# Patient Record
Sex: Female | Born: 1980 | Race: Black or African American | Hispanic: No | Marital: Single | Smoking: Never smoker
Health system: Southern US, Community
[De-identification: ages and names within clinical notes are randomized; demographics above are authoritative.]

---

## 2003-08-24 ENCOUNTER — Emergency Department (HOSPITAL_COMMUNITY): Admission: EM | Admit: 2003-08-24 | Discharge: 2003-08-24 | Payer: Self-pay | Admitting: Emergency Medicine

## 2004-01-04 ENCOUNTER — Ambulatory Visit: Payer: Self-pay | Admitting: Obstetrics & Gynecology

## 2004-02-22 ENCOUNTER — Ambulatory Visit: Payer: Self-pay | Admitting: Obstetrics and Gynecology

## 2004-05-18 ENCOUNTER — Ambulatory Visit: Payer: Self-pay | Admitting: Obstetrics and Gynecology

## 2004-07-18 ENCOUNTER — Ambulatory Visit: Payer: Self-pay | Admitting: Obstetrics & Gynecology

## 2010-05-19 NOTE — Group Therapy Note (Signed)
NAMEDOTTI, BUSEY NO.:  [id number]   MEDICAL RECORD NO.:  [id number]          PATIENT TYPE:  WOC   LOCATION:  [facility]                   FACILITY:  [facility]   PHYSICIAN:  Elsie Lincoln, MD      DATE OF BIRTH:  [birth year]   DATE OF SERVICE:  07/18/2004                                    CLINIC NOTE   REASON FOR VISIT:  The patient is a 30 year old female who presents for  follow-up of her TCA treatment and new-onset vulvar irritation. The patient  had been tested for HSV in the past and the irritation went away and HSV was  negative. However, now it returns. The patient shaved in this area just  today after realizing the irritation. She does not usually shave in this  area. The area that is bothering her is her left superior labia majora near  her clitoris just at the edge of the hairline. The patient denies being  sexually active at this time. She denies any abnormal discharge. The patient  also noted she has two small warts at the fourchette near her anus and would  like these treated as well today. The patient's last Pap smear per the  patient [facility].   PHYSICAL EXAMINATION:  Tanner V. The vulva has two small warts posteriorly,  slightly above the anus, and there is also areas of excoriation near the  clitoris on the left labia majora. There is no weeping lesion or vesicle.  The vagina has a thickened white discharge, probably physiologic.   ASSESSMENT AND PLAN:  A 30 year old female with:  1.  Condyloma acuminata. TCA applied today.  2.  Contact dermatitis. The patient is to apply hydrocortisone ointment 1%      b.i.d. for a week to see if this improves this area near her clitoris.      The patient encouraged not to shave these areas.  3.  Wet prep, GC, chlamydia sent.  4.  Return to clinic p[facility]r.n.; otherwise[facility] for      Pap smear.       KL/MEDQ  D:  07/18/2004  T:  07/18/2004  Job:   [id number]

## 2010-05-19 NOTE — Group Therapy Note (Signed)
NAMENATANE, HEWARD NO.:  [id number]   MEDICAL RECORD NO.:  [id number]          PATIENT TYPE:  WOC   [facility]ATION[facility]                   FACILITY:  [facility]   PHYSICIAN:  Elsie Lincoln, MD      DATE OF BIRTH:  [birth year]   DATE OF SERVICE:  01/04/2004                                    CLINIC NOTE   REASON FOR VISIT:  The patient is a 30 year old G0 female who is referred to  me from the health department for condyloma.  The patient is in a monogamous  relationship for the past 5 years and has only had one partner.  She is  GC/chlamydia negative and had a normal Pap smear done July 29, 2003.  The  patient is not overly bothered by the condyloma but wants them off due to  the psychological aspects.  Discussed with the patient options of watchful  waiting versus Aldara versus TCA application.  The patient chose TCA  secondary to the expense of the Aldara.   PHYSICAL EXAMINATION:  See diagram in note.  TCA 80% was then applied to the  condyloma at the introitus and along the perineum.  Vaseline was applied to  the surrounding area to aid in pain.  The patient was also given an ice pack  for several minutes after application.  The patient tolerated this procedure  well.  The patient is to return to clinic in 2 weeks.      KL/MEDQ  D:  01/04/2004  T:  01/04/2004  Job:  [id number]

## 2010-05-19 NOTE — Group Therapy Note (Signed)
NAMEJAYE, SAAL NO.:  [id number]   MEDICAL RECORD NO.:  [id number]          PATIENT TYPE:  WOC   [facility]ATION[facility]                   [facility]ILITY[facility]  [facility]   PHYSICIAN:  Argentina Donovan, MD        DATE OF BIRTH:  07-03-1980   DATE OF SERVICE:  05/18/2004                                    CLINIC NOTE   CHIEF COMPLAINT:  Followup of genital warts.   HISTORY OF PRESENT ILLNESS:  This is a 30 year old African-American female  coming in today for followup of genital warts after having chloroacetic acid  treatment on February 22, 2004.  The patient states that she has not had any  complications from the treatment, and no concerns there.  However, she does  report having some vaginal irritation and burning on the outside.  The  patient states that she has had this, she said, on and off for weeks.  No  new discharge.  No dysuria.  No abdominal pain.  No fevers.   OBJECTIVE:  VITAL SIGNS:  Vital signs noted, and stable, and within normal  limits.  GENERAL:  She is alert in no apparent distress.  PELVIC:  No external genital warts of the labia or vulvae.  However, 3 small  ulcers at the apex of the vulvae, approximately 1.0 mm in size, painful to  touch, resembling HSV.   ASSESSMENT:  A 30 year old African-American female with genital warts and  vaginal irritation.   PLAN:  1.  Genital warts.  No visual genital warts at this time.  The patient was      told to call and schedule followup as needed for additional treatments.  2.  Vaginal irritation.  Considering the ulcers noted on exam, quite likely      secondary to herpes simplex virus.  A viral culture was obtained today.      A wet prep was also obtained considering the complaint of pruritus as      well and some white discharge noted on exam.      AD/MEDQ  D:  05/18/2004  T:  05/18/2004  Job:  [id number]

## 2013-05-29 ENCOUNTER — Other Ambulatory Visit (HOSPITAL_COMMUNITY): Payer: Self-pay | Admitting: *Deleted

## 2013-05-29 DIAGNOSIS — N63 Unspecified lump in unspecified breast: Secondary | ICD-10-CM

## 2013-06-02 ENCOUNTER — Encounter (HOSPITAL_COMMUNITY): Payer: Self-pay

## 2013-06-02 ENCOUNTER — Other Ambulatory Visit (HOSPITAL_COMMUNITY)
Admission: RE | Admit: 2013-06-02 | Discharge: 2013-06-02 | Disposition: A | Discharge status: Home / Self Care | Payer: No Typology Code available for payment source | Source: Ambulatory Visit | Attending: Obstetrics and Gynecology | Admitting: Obstetrics and Gynecology

## 2013-06-02 ENCOUNTER — Ambulatory Visit (HOSPITAL_COMMUNITY)
Admission: RE | Admit: 2013-06-02 | Discharge: 2013-06-02 | Disposition: A | Discharge status: Home / Self Care | Payer: No Typology Code available for payment source | Source: Ambulatory Visit | Attending: Obstetrics and Gynecology | Admitting: Obstetrics and Gynecology

## 2013-06-02 VITALS — BP 108/76 | Temp 98.9°F | Ht 61.0 in | Wt 152.0 lb

## 2013-06-02 DIAGNOSIS — N644 Mastodynia: Secondary | ICD-10-CM | POA: Insufficient documentation

## 2013-06-02 DIAGNOSIS — Z01419 Encounter for gynecological examination (general) (routine) without abnormal findings: Secondary | ICD-10-CM

## 2013-06-02 NOTE — Progress Notes (Signed)
Complaints of bilateral breast pain. Patient complained of stabbing sharp right breast pain x 3 weeks that she rated at a 9 out of 10. Patient complained of a dull pain within the left breast that hurst when talking and breathing x 3 days. Patient rated pain in left breast at a 7-8 out of 10.  Pap Smear:  Pap smear completed today. Patients last Pap smear was in 2012 at the [facility] and normal per patient. Per patient has no history of an abnormal Pap smear. Pap smear 06/07/2008 is in EPIC.  Physical exam: Breasts Breasts symmetrical. No skin abnormalities bilateral breasts. No nipple retraction bilateral breasts. No nipple discharge bilateral breasts. No lymphadenopathy. No lumps palpated bilateral breasts. No complaints of pain or tenderness on exam. Referred patient to the [facility] for diagnostic mammogram. Appointment scheduled for Wednesday, June 10, 2013 at [facility].          Pelvic/Bimanual   Ext Genitalia No lesions, no swelling and no discharge observed on external genitalia.         Vagina Vagina pink and normal texture. No lesions or discharge observed in vagina.          Cervix Cervix is present. Cervix pink and of normal texture. No discharge observed.     Uterus Uterus is present and palpable. Uterus in normal position and normal size.        Adnexae Bilateral ovaries present and palpable. No tenderness on palpation.          Rectovaginal No rectal exam completed today since patient had no rectal complaints. No skin abnormalities observed on exam.

## 2013-06-02 NOTE — Patient Instructions (Signed)
[facility] how to perform BSE and gave educational materials to take home. Let her know [facility] will cover Pap smears every 3 years unless has a history of abnormal Pap smears. Referred patient to the [facility] for diagnostic mammogram. Appointment scheduled for Wednesday, June 10, 2013 at [place]. Patient aware of appointment and will be there. Let patient know will follow up with her within the next couple weeks with results of Pap smear by phone. Alisia Ferrari verbalized understanding.  Shirley Muscat, RN 9:58 AM

## 2013-06-10 ENCOUNTER — Ambulatory Visit
Admission: RE | Admit: 2013-06-10 | Discharge: 2013-06-10 | Disposition: A | Discharge status: Home / Self Care | Payer: No Typology Code available for payment source | Source: Ambulatory Visit | Attending: Obstetrics and Gynecology | Admitting: Obstetrics and Gynecology

## 2013-06-10 ENCOUNTER — Other Ambulatory Visit (HOSPITAL_COMMUNITY): Payer: Self-pay | Admitting: Obstetrics and Gynecology

## 2013-06-10 ENCOUNTER — Encounter (INDEPENDENT_AMBULATORY_CARE_PROVIDER_SITE_OTHER): Payer: Self-pay

## 2013-06-10 DIAGNOSIS — N63 Unspecified lump in unspecified breast: Secondary | ICD-10-CM

## 2013-06-16 ENCOUNTER — Telehealth (HOSPITAL_COMMUNITY): Payer: Self-pay | Admitting: *Deleted

## 2013-06-16 NOTE — Telephone Encounter (Signed)
Telephoned patient at work and discussed normal pap smear results. Next pap due in 3 years. Patient voiced understanding.

## 2013-12-02 ENCOUNTER — Other Ambulatory Visit: Payer: Self-pay | Admitting: Obstetrics and Gynecology

## 2013-12-02 DIAGNOSIS — N6320 Unspecified lump in the left breast, unspecified quadrant: Secondary | ICD-10-CM

## 2013-12-15 ENCOUNTER — Other Ambulatory Visit: Payer: No Typology Code available for payment source

## 2013-12-18 ENCOUNTER — Ambulatory Visit
Admission: RE | Admit: 2013-12-18 | Discharge: 2013-12-18 | Disposition: A | Discharge status: Home / Self Care | Payer: No Typology Code available for payment source | Source: Ambulatory Visit | Attending: Obstetrics and Gynecology | Admitting: Obstetrics and Gynecology

## 2013-12-18 DIAGNOSIS — N6320 Unspecified lump in the left breast, unspecified quadrant: Secondary | ICD-10-CM

## 2014-05-25 ENCOUNTER — Other Ambulatory Visit: Payer: Self-pay | Admitting: Obstetrics and Gynecology

## 2014-05-25 DIAGNOSIS — D242 Benign neoplasm of left breast: Secondary | ICD-10-CM

## 2014-05-26 ENCOUNTER — Other Ambulatory Visit: Payer: Self-pay | Admitting: Obstetrics and Gynecology

## 2014-05-26 ENCOUNTER — Other Ambulatory Visit: Payer: Self-pay

## 2014-05-26 DIAGNOSIS — D242 Benign neoplasm of left breast: Secondary | ICD-10-CM

## 2014-06-21 ENCOUNTER — Other Ambulatory Visit: Payer: Self-pay

## 2014-10-31 ENCOUNTER — Emergency Department (HOSPITAL_COMMUNITY): Payer: Self-pay

## 2014-10-31 ENCOUNTER — Encounter (HOSPITAL_COMMUNITY): Payer: Self-pay | Admitting: *Deleted

## 2014-10-31 ENCOUNTER — Emergency Department (HOSPITAL_COMMUNITY)
Admission: EM | Admit: 2014-10-31 | Discharge: 2014-10-31 | Disposition: A | Discharge status: Home / Self Care | Payer: Self-pay | Attending: Emergency Medicine | Admitting: Emergency Medicine

## 2014-10-31 DIAGNOSIS — R079 Chest pain, unspecified: Secondary | ICD-10-CM | POA: Insufficient documentation

## 2014-10-31 DIAGNOSIS — R002 Palpitations: Secondary | ICD-10-CM | POA: Insufficient documentation

## 2014-10-31 LAB — CBC
HCT: 40.1 % (ref 36.0–46.0)
Hemoglobin: 13.6 g/dL (ref 12.0–15.0)
MCH: 30.8 pg (ref 26.0–34.0)
MCHC: 33.9 g/dL (ref 30.0–36.0)
MCV: 90.9 fL (ref 78.0–100.0)
Platelets: 244 10*3/uL (ref 150–400)
RBC: 4.41 MIL/uL (ref 3.87–5.11)
RDW: 13.2 % (ref 11.5–15.5)
WBC: 7.4 10*3/uL (ref 4.0–10.5)

## 2014-10-31 LAB — I-STAT TROPONIN, ED: Troponin i, poc: 0 ng/mL (ref 0.00–0.08)

## 2014-10-31 LAB — BASIC METABOLIC PANEL
ANION GAP: 10 (ref 5–15)
BUN: 8 mg/dL (ref 6–20)
CO2: 23 mmol/L (ref 22–32)
CREATININE: 0.87 mg/dL (ref 0.44–1.00)
Calcium: 9.1 mg/dL (ref 8.9–10.3)
Chloride: 99 mmol/L — ABNORMAL LOW (ref 101–111)
GFR calc Af Amer: >60 mL/min (ref >60)
GFR calc non Af Amer: >60 mL/min (ref >60)
Glucose, Bld: 101 mg/dL — ABNORMAL HIGH (ref 65–99)
POTASSIUM: 3.1 mmol/L — AB (ref 3.5–5.1)
SODIUM: 132 mmol/L — AB (ref 135–145)

## 2014-10-31 MED ORDER — POTASSIUM CHLORIDE CRYS ER 20 MEQ PO TBCR
40.0000 meq | EXTENDED_RELEASE_TABLET | Freq: Once | ORAL | Status: AC
  Administered 2014-10-31: 40 meq via ORAL
  Filled 2014-10-31: qty 2

## 2014-10-31 NOTE — ED Notes (Signed)
PT states that her heart is struggling to beat and feels palpitations.  PT feels a "pulling" sensation. Felt like at times not getting enough air.

## 2014-10-31 NOTE — ED Notes (Signed)
Reviewed discharge instructions with patient, who verbalized understanding.  Patient states she will call the [facility] tomorrow for a follow-up appointment tomorrow. VSS.

## 2014-10-31 NOTE — Discharge Instructions (Signed)
[facility]-- call tomorrow morning to make appt. Return to the ED for new or worsening symptoms.

## 2014-10-31 NOTE — ED Provider Notes (Signed)
CSN: [id number]     Arrival date & time 10/31/14  1811 History   First MD Initiated Contact with Patient 10/31/14 2135     Chief Complaint  Patient presents with  . Chest Pain     (Consider location/radiation/quality/duration/timing/severity/associated sxs/prior Treatment) Patient is a 35 y.o. female presenting with chest pain. The history is provided by the patient and medical records.  Chest Pain Associated symptoms: palpitations     34 year old female with no significant past medical history presenting to the ED for chest pain. Patient states she was sitting at home relaxing on the couch when also and she began to feel that her heart was racing and she had palpitations. She states she had a dull ache in the left side of her chest which lasted for approximately 5 minutes before resolving completely. He states several hours later the pain returned, this time described as a "pulling sensation", however this again resolved without intervention.  No recurrence of symptoms since. Patient denies any shortness of breath, dizziness, diaphoresis, nausea, or vomiting. Patient has no known cardiac history. Chest x-ray family cardiac history. She is not a smoker. Patient denies any recent travel, lower extremity edema, calf pain, prolonged immobilization, surgery, or trauma.  No exogenous estrogens.  No history of DVT or PE.  Patient states she has only had one other episode of chest pain similar to this in the past which she was told was stress induced. Patient denies any unusual stress recently. Vital signs stable. No current PCP.  History reviewed. No pertinent past medical history. History reviewed. No pertinent past surgical history. No family history on file. Social History  Substance Use Topics  . Smoking status: Never Smoker   . Smokeless tobacco: Never Used  . Alcohol Use: No   OB History    Gravida Para Term Preterm AB TAB SAB Ectopic Multiple Living   0              Review of Systems   Cardiovascular: Positive for chest pain and palpitations.  All other systems reviewed and are negative.     Allergies  Review of patient's allergies indicates no known allergies.  Home Medications   Prior to Admission medications   Not on File   BP 108/66 mmHg  Pulse 68  Temp(Src) 98.5 F (36.9 C) (Oral)  Resp 13  SpO2 100%  LMP 10/22/2014   Physical Exam  Constitutional: She is oriented to person, place, and time. She appears well-developed and well-nourished. No distress.  HENT:  Head: Normocephalic and atraumatic.  Mouth/Throat: Oropharynx is clear and moist.  Eyes: Conjunctivae and EOM are normal. Pupils are equal, round, and reactive to light.  Neck: Normal range of motion. Neck supple.  Cardiovascular: Normal rate, regular rhythm and normal heart sounds.   Pulmonary/Chest: Effort normal and breath sounds normal. No respiratory distress. She has no wheezes.  Abdominal: Soft. Bowel sounds are normal. There is no tenderness. There is no guarding.  Musculoskeletal: Normal range of motion. She exhibits no edema.  No calf asymmetry, tenderness, palpable cords, overlying erythema, or warmth to touch No pitting edema  Neurological: She is alert and oriented to person, place, and time.  Skin: Skin is warm and dry. She is not diaphoretic.  Psychiatric: She has a normal mood and affect.  Nursing note and vitals reviewed.   ED Course  Procedures (including critical care time) Labs Review Labs Reviewed  BASIC METABOLIC PANEL - Abnormal; Notable for the following:    Sodium 132 (*)  Potassium 3.1 (*)    Chloride 99 (*)    Glucose, Bld 101 (*)    All other components within normal limits  CBC  I-STAT TROPOININ, ED    Imaging Review Dg Chest 2 View  10/31/2014  CLINICAL DATA:  Chest pain for 1 day. EXAM: CHEST  2 VIEW COMPARISON:  None. FINDINGS: The heart size and mediastinal contours are within normal limits. Both lungs are clear. The visualized skeletal  structures are unremarkable. IMPRESSION: Normal chest. Electronically Signed   By: Elon Alas M.D.   On: 10/31/2014 18:54   I have personally reviewed and evaluated these images and lab results as part of my medical decision-making.   EKG Interpretation None      MDM   Final diagnoses:  Chest pain, unspecified chest pain type  Palpitations   34 year old female here with chest pain and palpitations this afternoon.  No cardiac hx or cardiac RF. EKG sinus tachycardia, no acute ischemic changes. Patient had 1 HR documented at 117, however returned to [facility] without intervention and has remained normal throughout ED visit.  Lab work remarkable for mild hypokalemia at 3.1 which was replaced in the emergency department. Troponin is negative. Chest x-ray is clear. Patient remains asymptomatic here in the emergency department. Heart score of 0.  No risk factors or history of DVT/PE.  Given negative workup, low suspicion for ACS, PE, dissection, or other acute cardiac event at this time. Patient appears stable for discharge. Patient will be referred to the wellness clinic for follow-up.  Discussed plan with patient, he/she acknowledged understanding and agreed with plan of care.  Return precautions given for new or worsening symptoms.  Larene Pickett, PA-C 11/01/14 0018  Daleen Bo, MD 11/01/14 [phone]

## 2014-11-04 ENCOUNTER — Encounter: Payer: Self-pay | Admitting: *Deleted

## 2014-11-05 ENCOUNTER — Ambulatory Visit: Payer: Self-pay | Attending: Family Medicine | Admitting: Physician Assistant

## 2014-11-05 ENCOUNTER — Encounter: Payer: Self-pay | Admitting: Physician Assistant

## 2014-11-05 VITALS — BP 124/77 | HR 95 | Temp 98.6°F | Resp 20 | Ht 60.0 in | Wt 153.2 lb

## 2014-11-05 DIAGNOSIS — R079 Chest pain, unspecified: Secondary | ICD-10-CM

## 2014-11-05 DIAGNOSIS — E876 Hypokalemia: Secondary | ICD-10-CM

## 2014-11-05 DIAGNOSIS — R002 Palpitations: Secondary | ICD-10-CM | POA: Insufficient documentation

## 2014-11-05 LAB — BASIC METABOLIC PANEL
BUN: 11 mg/dL (ref 7–25)
CHLORIDE: 104 mmol/L (ref 98–110)
CO2: 25 mmol/L (ref 20–31)
CREATININE: 0.82 mg/dL (ref 0.50–1.10)
Calcium: 9.2 mg/dL (ref 8.6–10.2)
Glucose, Bld: 106 mg/dL — ABNORMAL HIGH (ref 65–99)
Potassium: 3.7 mmol/L (ref 3.5–5.3)
Sodium: 137 mmol/L (ref 135–146)

## 2014-11-05 NOTE — Progress Notes (Signed)
Patient here for HFU for chest pain.  Patient denies pain at this time. Patient states chest pain has been mild since leaving the hospital.  Patient has increased potassium intake with bananas.  Patient complains of tingling/numbness around her right jaw line which she noticed Wednesday.

## 2014-11-05 NOTE — Progress Notes (Signed)
   Sandra Holden  UXN:[id number]  URK:[id number]  DOB - [birth year]  Chief Complaint  Patient presents with  . Hospitalization Follow-up  . Chest Pain       Subjective:   Sandra Holden is a 34 y.o. female here today for  Establishment of care. She went to the emergency department on 10/31/2014 with complaints of chest pain and palpitations. She was sitting with the initial episode occurred. Was located to the left chest described as a dull ache that lasted approximately 5 minutes. It resolved on its own but reoccurred to 3 times a day for she went to the emergency department further attention. She has no risk factors for coronary disease. She has not recently traveled. She's not recently suffered any trauma. Her EKG showed sinus tachycardia with a heart rate of 115 bpm. Her potassium was 3.1 and was replaced in the emergency department. Her troponin was negative. Her chest x-ray was negative. She was not sent home on any medication she was told to follow-up here for further evaluation.  Since discharge she's had no recurrence of chest pain or palpitations. She feels a lot better. She just has a lot of concerns in regards to her risk for events in the future.  ROS: GEN: denies fever or chills, denies change in weight LUNGS: denies SHOB, dyspnea, PND, orthopnea CV: denies CP or palpitations EXT: denies muscle spasms or swelling; no pain in lower ext, no weakness NEURO: denies numbness or tingling, denies sz, stroke or TIA  ALLERGIES: No Known Allergies  PAST MEDICAL HISTORY: History reviewed. No pertinent past medical history.  PAST SURGICAL HISTORY: History reviewed. No pertinent past surgical history.  MEDICATIONS AT HOME: Prior to Admission medications   Not on File    Objective:   Filed Vitals:   11/05/14 1442  BP: 124/77  Pulse: 95  Temp: 98.6 F (37 C)  TempSrc: Oral  Resp: 20  Height: 5' (1.524 m)  Weight: 153 lb 3.2 oz (69.491 kg)  SpO2: 98%    Exam General  appearance : Awake, alert, not in any distress. Speech Clear. Not toxic looking.  Chest:Good air entry bilaterally, no added sounds  CVS: S1 S2 regular, no murmurs.  Extremities: B/L Lower Ext shows no edema, both legs are warm to touch Neurology: Awake alert, and oriented X 3, CN II-XII intact, Non focalN/A    Assessment & Plan  1. Chest pain and palpitations-nonrecurrent  -Reassurance  -Risk factor modification  -No meds 2. Hypokalemia-replaced  -recheck K+ today    Return in about 3 months (around 02/05/2015).  The patient was given clear instructions to go to ER or return to medical center if symptoms don't improve, worsen or new problems develop. The patient verbalized understanding. The patient was told to call to get lab results if they haven't heard anything in the next week.   This note has been created with Surveyor, quantity. Any transcriptional errors are unintentional.    Zettie Pho, PA-C [facility] [place], [place]   11/05/2014, 2:51 PM

## 2014-11-09 ENCOUNTER — Telehealth: Payer: Self-pay | Admitting: *Deleted

## 2014-11-09 NOTE — Telephone Encounter (Signed)
Medical Assistant left message on patient's home and cell voicemail. Voicemail states to give a call back to Javanna Patin with [facility] at [phone].  

## 2014-12-03 ENCOUNTER — Encounter: Payer: Self-pay | Admitting: *Deleted

## 2014-12-03 NOTE — Telephone Encounter (Signed)
Medical Assistant left message on patient's home and cell voicemail. Voicemail states to give a call back to [place] with [facility] at [phone].  Please inform patient of potassium levels being back within normal range. Communication letter has been sent.

## 2016-04-06 ENCOUNTER — Encounter (HOSPITAL_COMMUNITY): Payer: Self-pay | Admitting: *Deleted

## 2016-05-02 IMAGING — CR DG CHEST 2V
2 series · 2 of 2 positions shown · non-contrast
Comparison: None.

CLINICAL DATA: Chest pain for 1 day.

EXAM:
CHEST  2 VIEW

[chest pa]
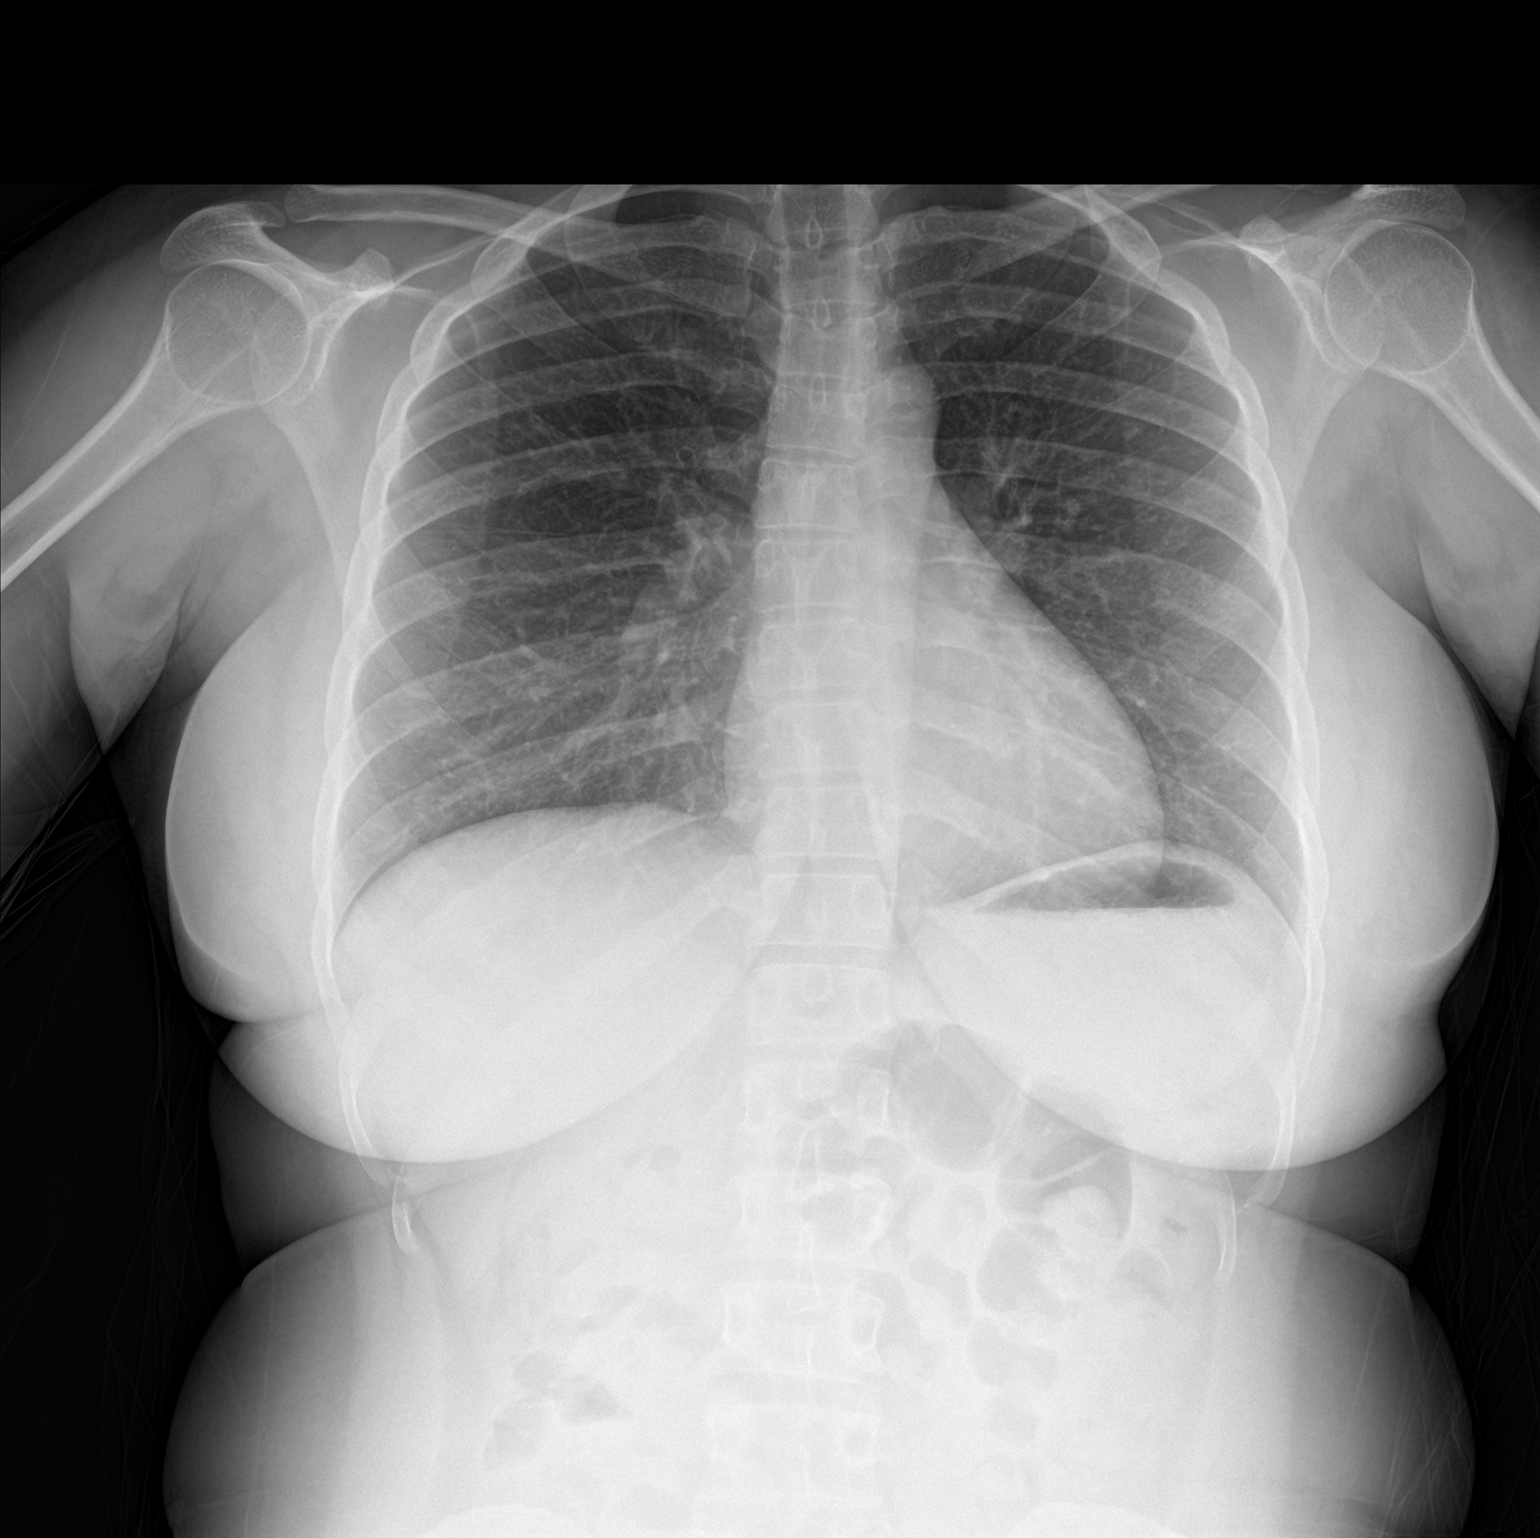

[chest lat]
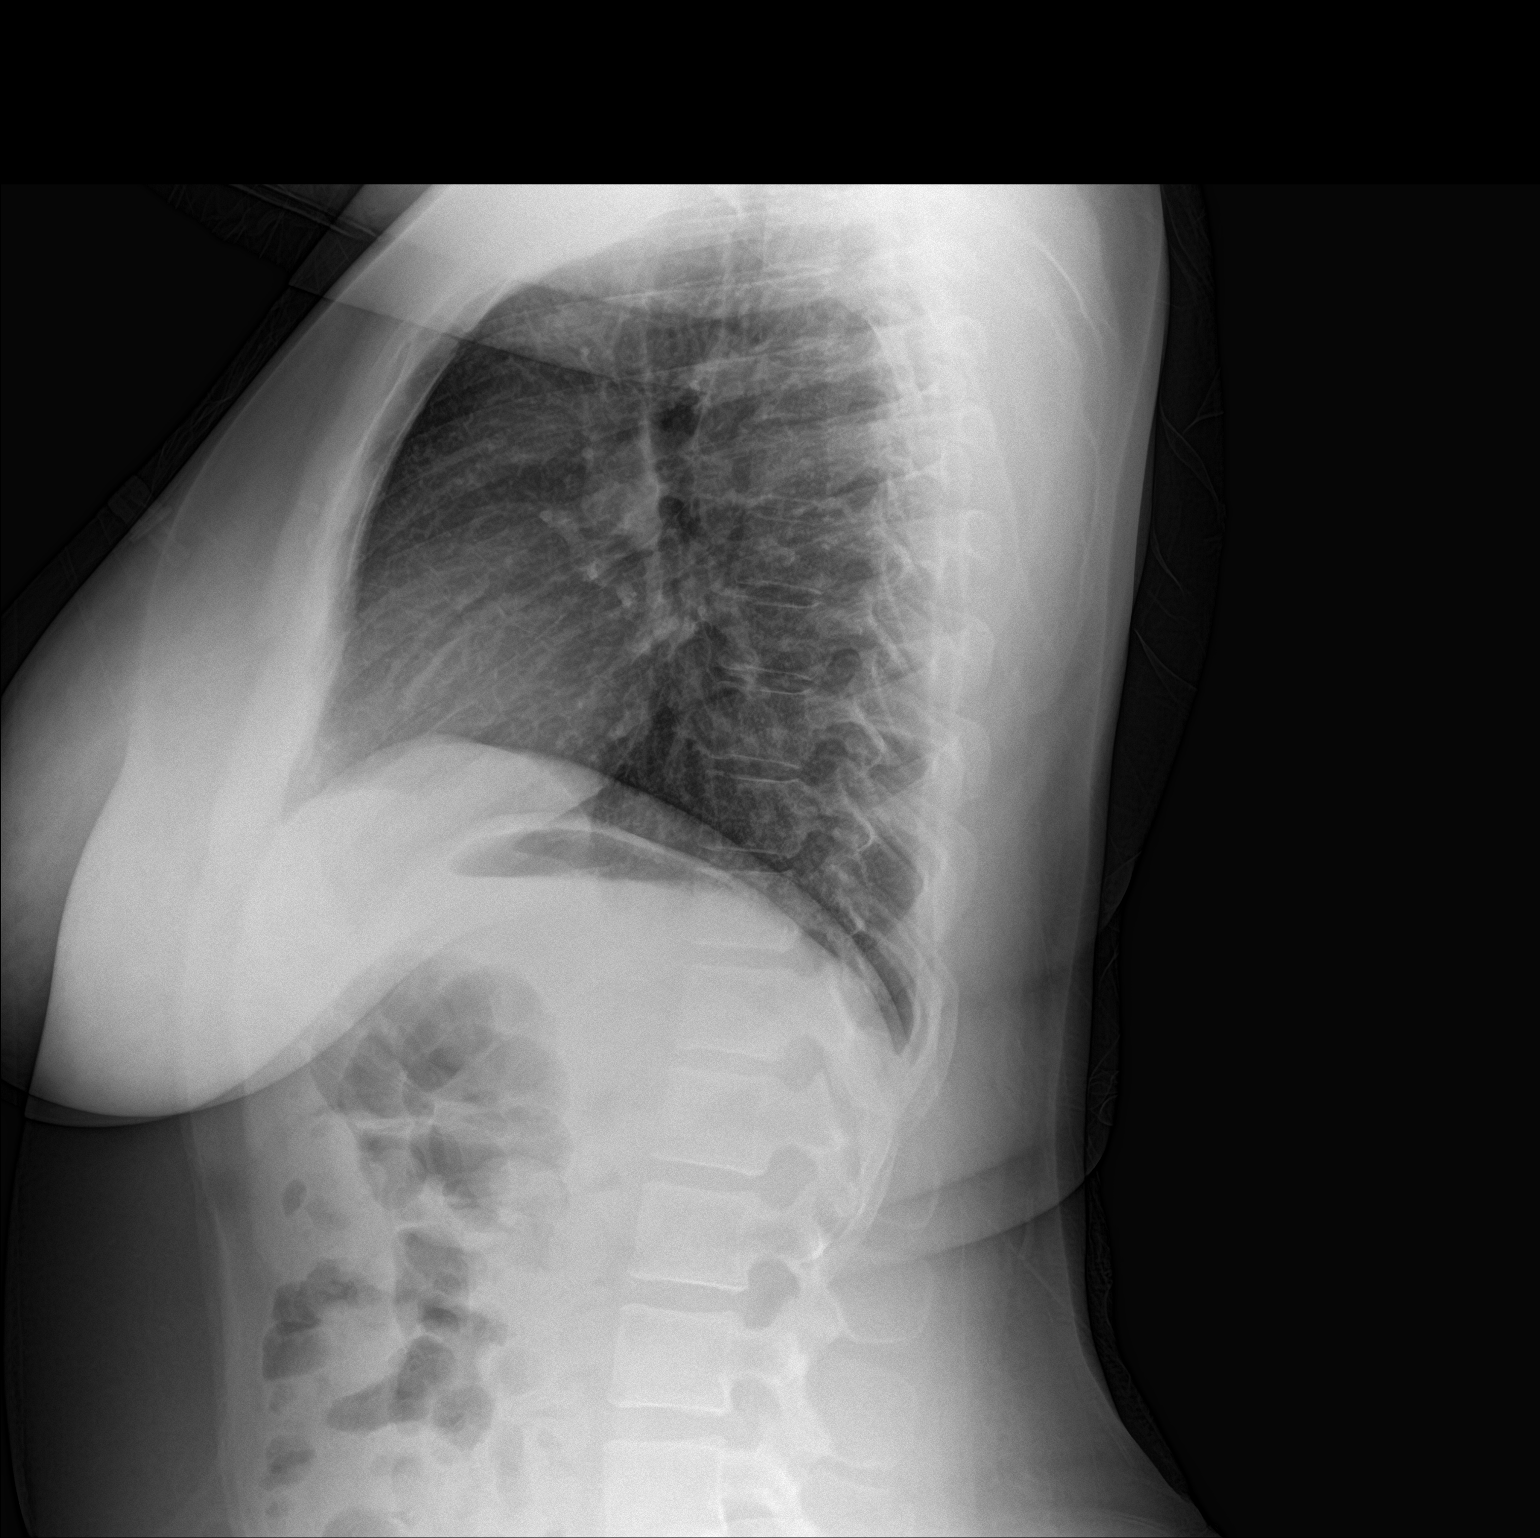

[2 of 2 positions shown; findings below may reference images not displayed]

FINDINGS: The heart size and mediastinal contours are within normal limits.
Both lungs are clear. The visualized skeletal structures are
unremarkable.
IMPRESSION: Normal chest.
